# Patient Record
Sex: Female | Born: 1975 | Race: Black or African American | Hispanic: No | Marital: Single | Smoking: Current every day smoker
Health system: Southern US, Community
[De-identification: ages and names within clinical notes are randomized; demographics above are authoritative.]

## PROBLEM LIST (undated history)

## (undated) DIAGNOSIS — D219 Benign neoplasm of connective and other soft tissue, unspecified: Secondary | ICD-10-CM

## (undated) HISTORY — PX: APPENDECTOMY: SHX54

## (undated) HISTORY — PX: ABDOMINAL HYSTERECTOMY: SHX81

---

## 2001-12-13 ENCOUNTER — Emergency Department (HOSPITAL_COMMUNITY): Admission: EM | Admit: 2001-12-13 | Discharge: 2001-12-13 | Payer: Self-pay | Admitting: *Deleted

## 2004-11-10 ENCOUNTER — Inpatient Hospital Stay (HOSPITAL_COMMUNITY): Admission: AD | Admit: 2004-11-10 | Discharge: 2004-11-10 | Payer: Self-pay | Admitting: Obstetrics and Gynecology

## 2004-11-17 ENCOUNTER — Encounter: Admission: RE | Admit: 2004-11-17 | Discharge: 2004-11-17 | Payer: Self-pay | Admitting: Obstetrics and Gynecology

## 2004-11-30 ENCOUNTER — Ambulatory Visit: Payer: Self-pay | Admitting: Family Medicine

## 2004-12-09 ENCOUNTER — Ambulatory Visit: Payer: Self-pay | Admitting: Family Medicine

## 2004-12-23 ENCOUNTER — Ambulatory Visit: Payer: Self-pay | Admitting: Family Medicine

## 2005-01-01 ENCOUNTER — Ambulatory Visit: Payer: Self-pay | Admitting: Family Medicine

## 2005-01-01 ENCOUNTER — Inpatient Hospital Stay (HOSPITAL_COMMUNITY): Admission: AD | Admit: 2005-01-01 | Discharge: 2005-01-02 | Payer: Self-pay | Admitting: Family Medicine

## 2005-01-04 ENCOUNTER — Ambulatory Visit: Payer: Self-pay | Admitting: Obstetrics & Gynecology

## 2005-01-04 ENCOUNTER — Inpatient Hospital Stay (HOSPITAL_COMMUNITY): Admission: AD | Admit: 2005-01-04 | Discharge: 2005-01-16 | Payer: Self-pay | Admitting: *Deleted

## 2005-01-06 ENCOUNTER — Encounter (INDEPENDENT_AMBULATORY_CARE_PROVIDER_SITE_OTHER): Payer: Self-pay | Admitting: Specialist

## 2005-01-11 ENCOUNTER — Ambulatory Visit: Payer: Self-pay | Admitting: Pulmonary Disease

## 2005-01-24 ENCOUNTER — Ambulatory Visit: Payer: Self-pay | Admitting: Family Medicine

## 2005-03-09 ENCOUNTER — Encounter: Admission: RE | Admit: 2005-03-09 | Discharge: 2005-03-09 | Payer: Self-pay | Admitting: *Deleted

## 2005-04-12 IMAGING — CT CT HEAD W/O CM
1 series · 16 of 25 positions shown, 20 images · IV contrast (agent unspecified)
Comparison: none

CLINICAL DATA: Headaches.  The patient is recently post-partum with HELLP syndrome.  
 HEAD CT WITHOUT CONTRAST: 
 Routine unenhanced study was performed.  Comparison 01/05/2005. 
 There is new subarachnoid hemorrhage over both frontal convexities, asymmetric to the right.  No parenchymal hemorrhage, brain edema or focal extraaxial fluid collection is demonstrated.  The ventricles are stable in size without evidence of hydrocephalus.  Air fluid level is again noted within the left maxillary sinus.  There is also stable near complete opacification of the mastoid air cells and middle ear on the left without associated coalescence.  The right mastoid air cells and right middle ear appear well aerated.

[Series 2: — · axial · 0.47mm/px · z∈[+6,+142]mm · 16 of 25 slices shown, 20 images]
[im 2/25  brain]
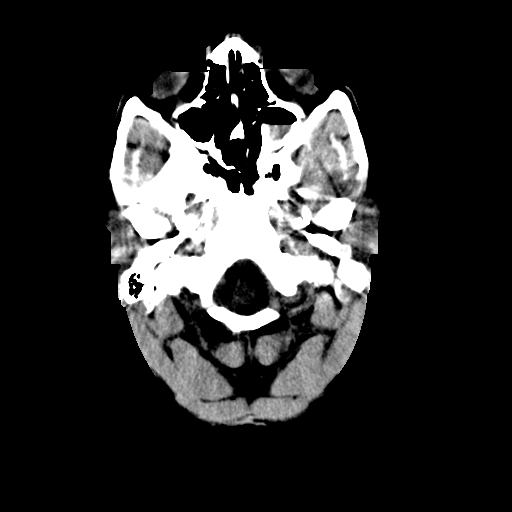
[im 2/25  bone]
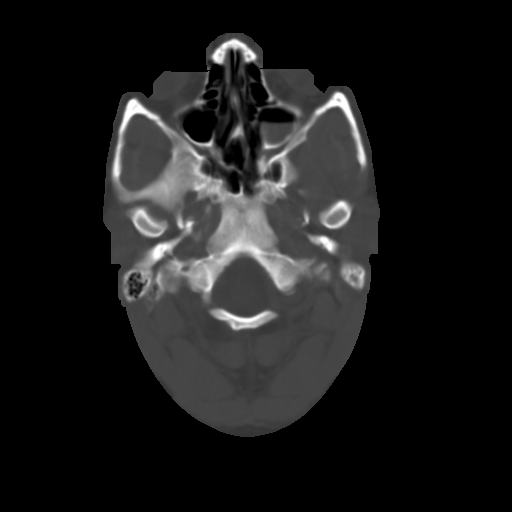
[im 4/25  brain]
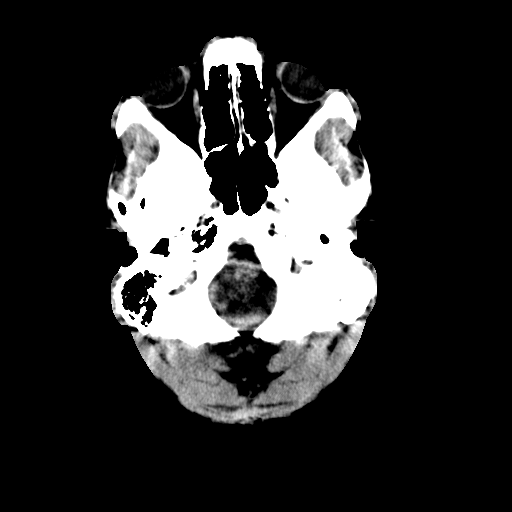
[im 5/25  brain]
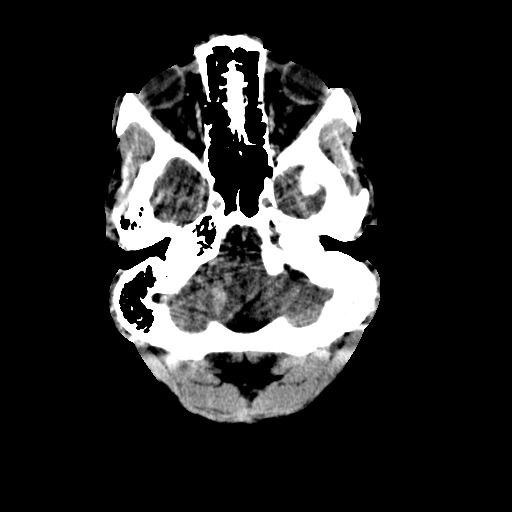
[im 7/25  brain]
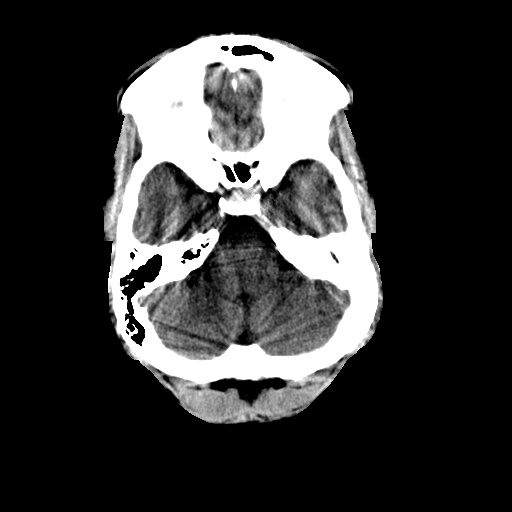
[im 8/25  brain]
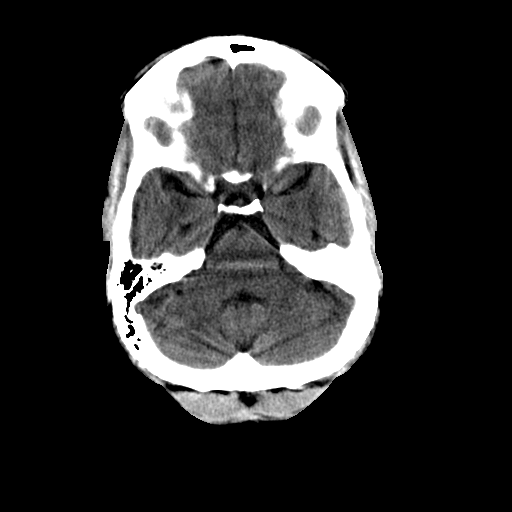
[im 8/25  bone]
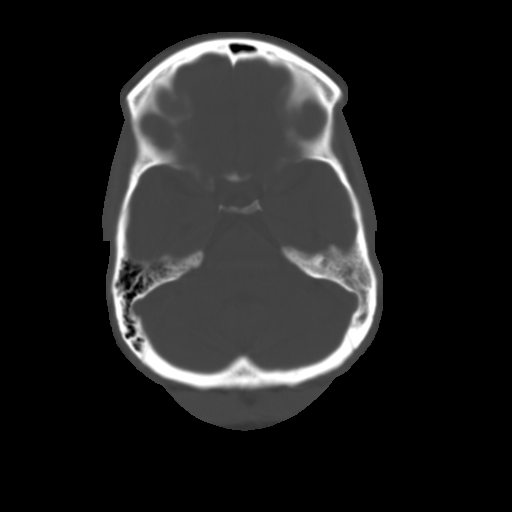
[im 9/25  brain]
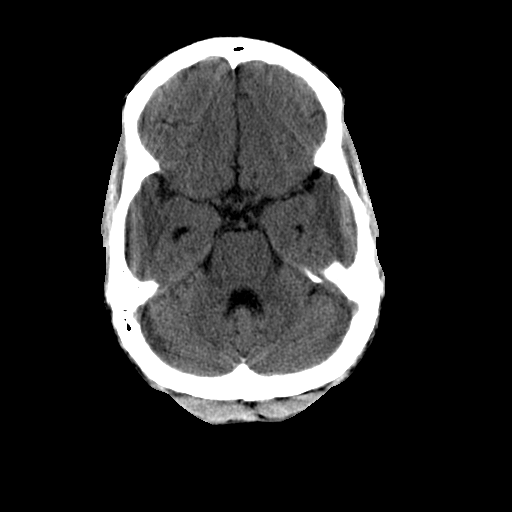
[im 11/25  brain]
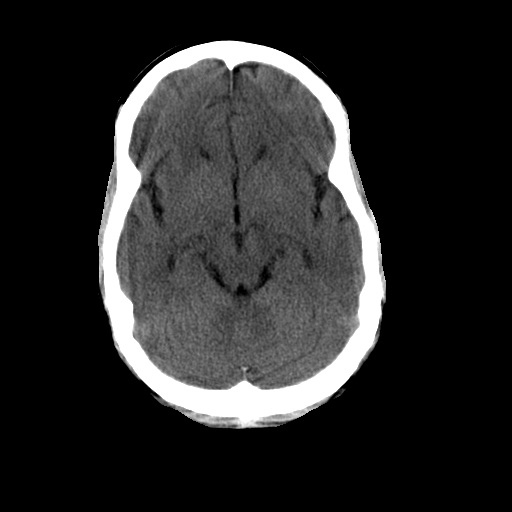
[im 12/25  brain]
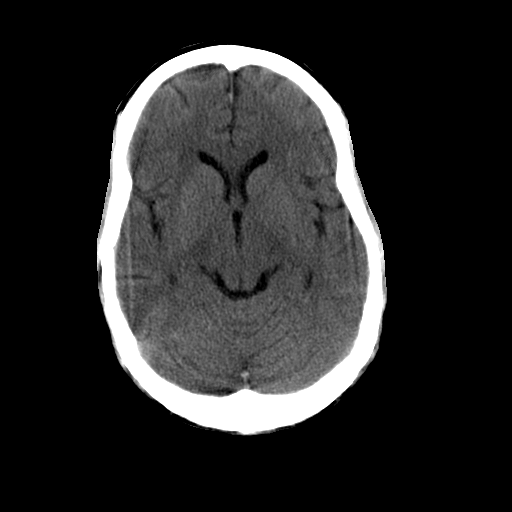
[im 14/25  brain]
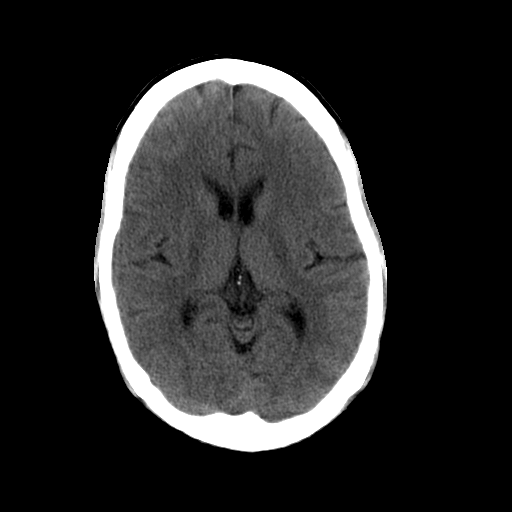
[im 14/25  bone]
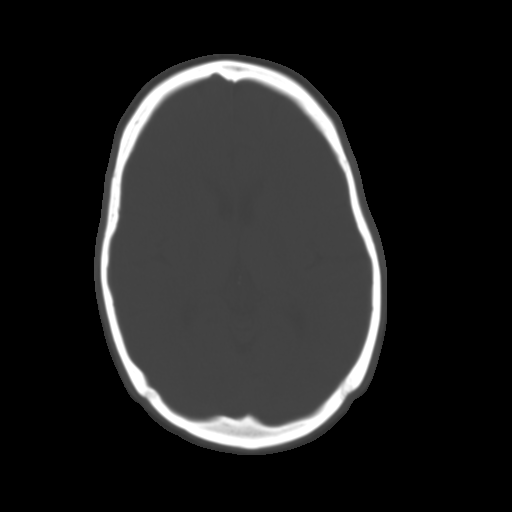
[im 15/25  brain]
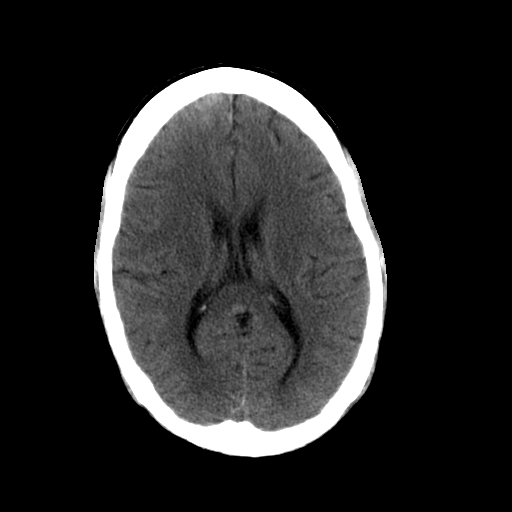
[im 17/25  brain]
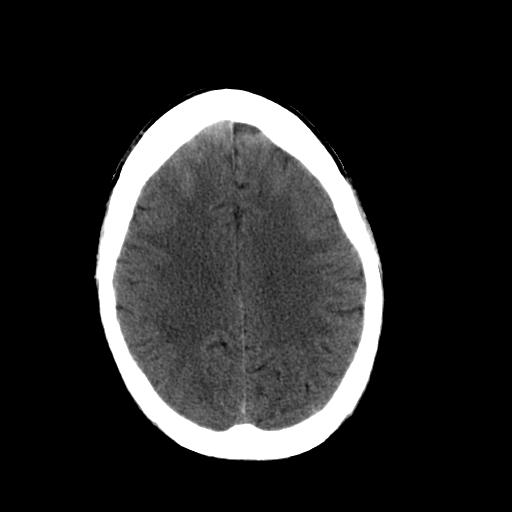
[im 18/25  brain]
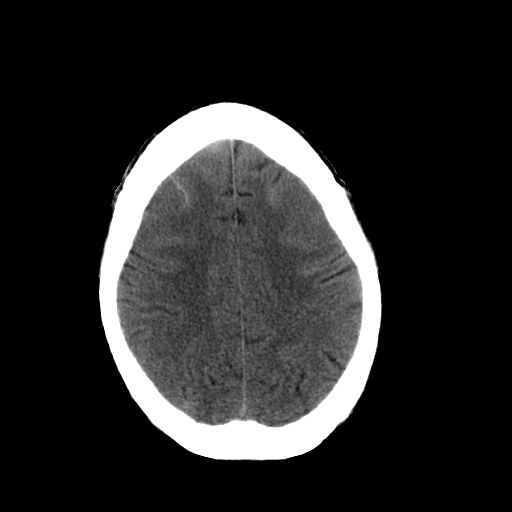
[im 19/25  brain]
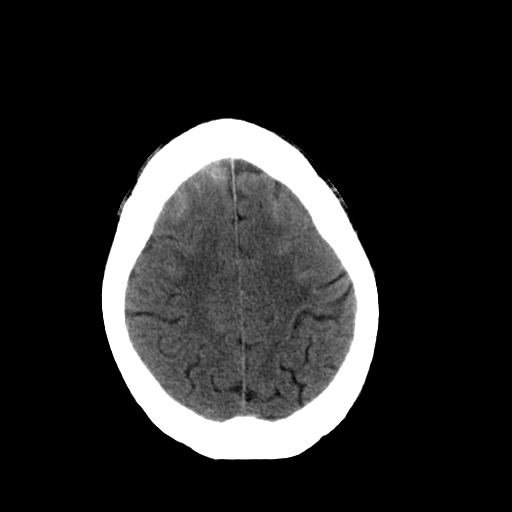
[im 19/25  bone]
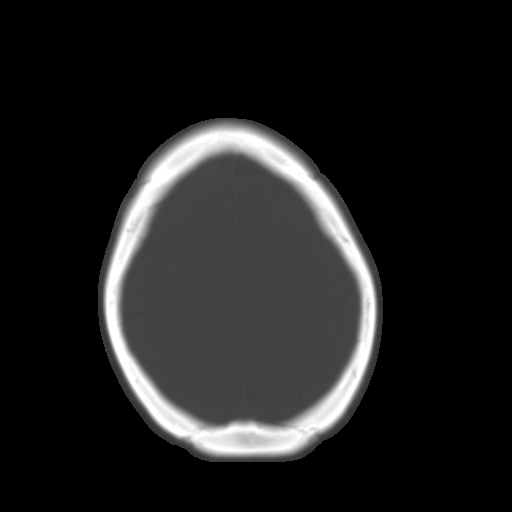
[im 21/25  brain]
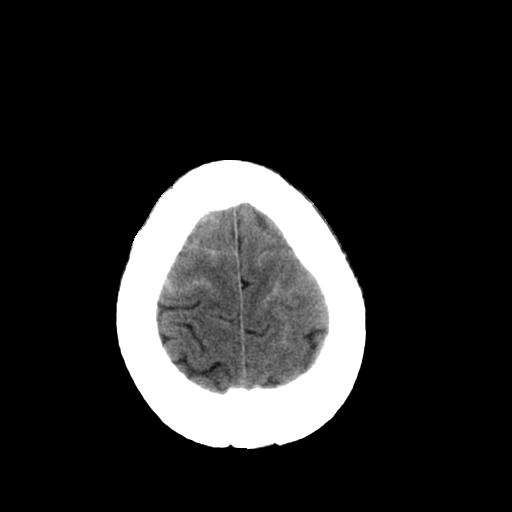
[im 22/25  brain]
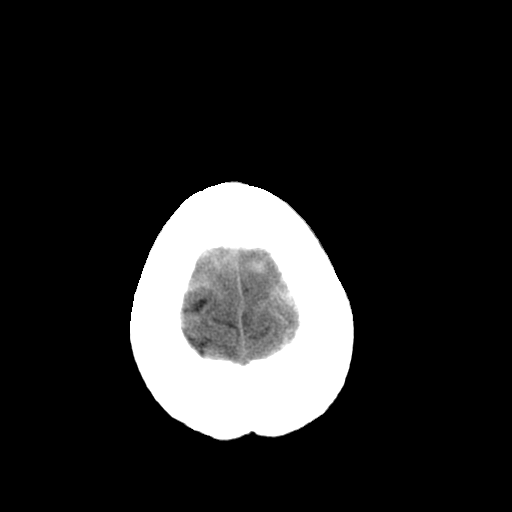
[im 24/25  brain]
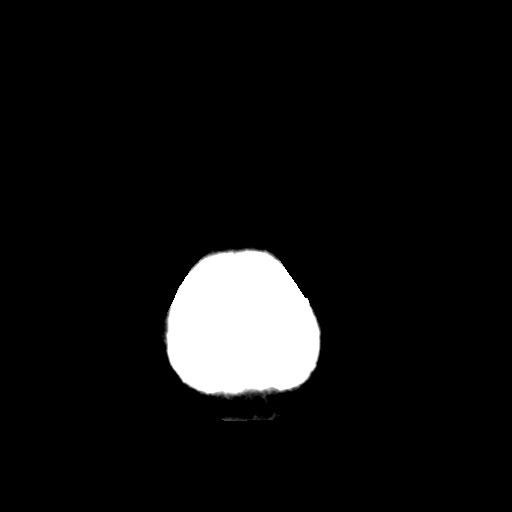

[16 of 25 positions shown; findings below may reference images not displayed]

IMPRESSION: 1. There is new subarachnoid hemorrhage over both frontal convexities, likely related to thrombocytopenia and HELLP syndrome.  No intraparenchymal hemorrhage or brain edema is demonstrated.      
 2.  Stable left maxillary sinus, left mastoid sinus and middle ear inflammatory changes.

## 2007-02-01 DIAGNOSIS — R51 Headache: Secondary | ICD-10-CM

## 2007-02-01 DIAGNOSIS — Z87891 Personal history of nicotine dependence: Secondary | ICD-10-CM | POA: Insufficient documentation

## 2007-02-01 DIAGNOSIS — I10 Essential (primary) hypertension: Secondary | ICD-10-CM | POA: Insufficient documentation

## 2007-02-01 DIAGNOSIS — R519 Headache, unspecified: Secondary | ICD-10-CM | POA: Insufficient documentation

## 2008-05-18 ENCOUNTER — Emergency Department (HOSPITAL_COMMUNITY): Admission: EM | Admit: 2008-05-18 | Discharge: 2008-05-18 | Payer: Self-pay | Admitting: Emergency Medicine

## 2008-05-21 ENCOUNTER — Inpatient Hospital Stay (HOSPITAL_COMMUNITY): Admission: AD | Admit: 2008-05-21 | Discharge: 2008-05-21 | Payer: Self-pay | Admitting: Obstetrics and Gynecology

## 2008-08-17 IMAGING — US US ART/VEN ABD/PELV/SCROTUM DOPPLER COMPLETE
1 series · 13 of 25 positions shown · non-contrast
Comparison: 01/06/2005

CLINICAL DATA: Right-sided pelvic pain.

TRANSABDOMINAL AND TRANSVAGINAL ULTRASOUND OF PELVIS
DOPPLER ULTRASOUND OF OVARIES
TECHNIQUE: Both transabdominal and transvaginal ultrasound
examinations of the pelvis were performed including evaluation of
the uterus, ovaries, adnexal regions, and pelvic cul-de-sac. Color
and duplex Doppler ultrasound was utilized to evaluate blood flow
to the ovaries.

[Series 1: unknown · 0.33mm/px · 13 of 57 slices shown]
[im 1/57]
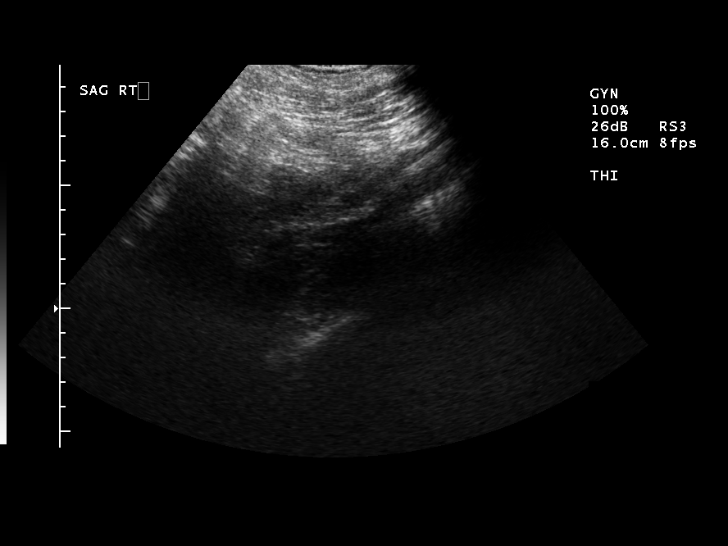
[im 5/57]
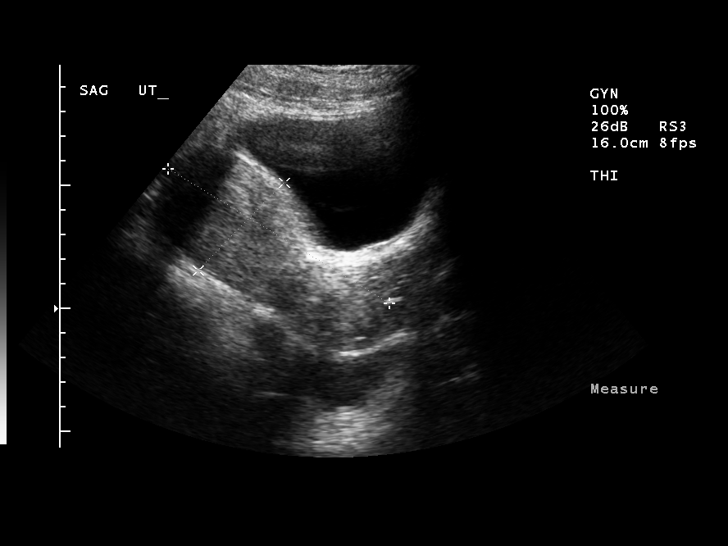
[im 10/57]
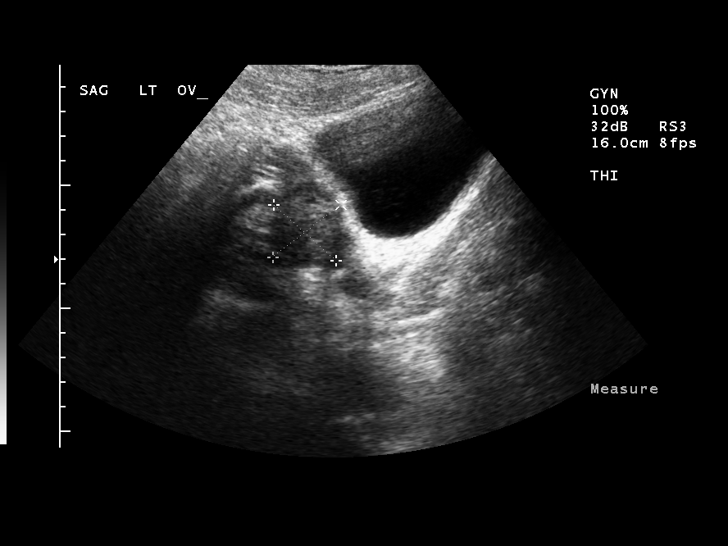
[im 15/57]
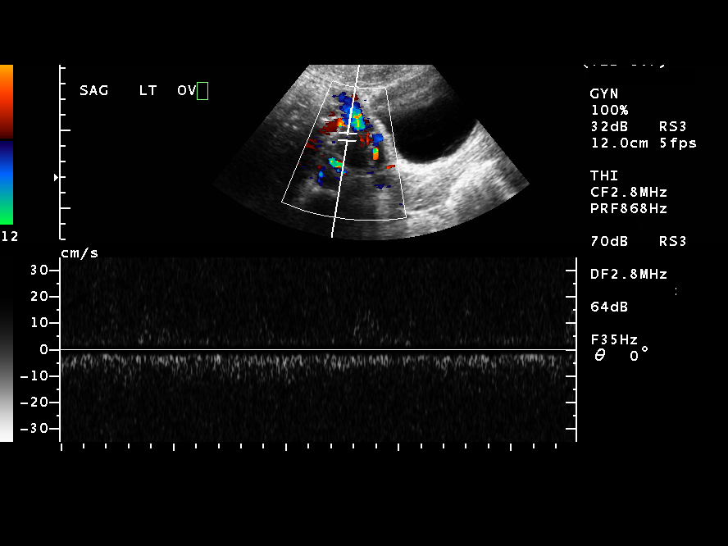
[im 19/57]
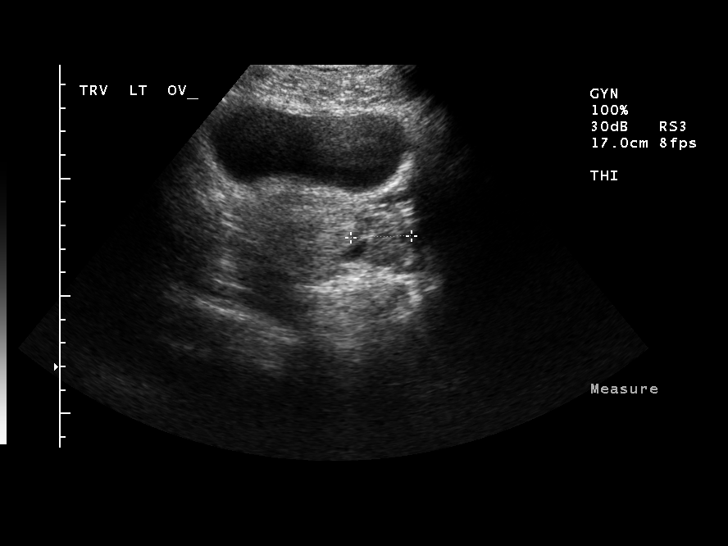
[im 24/57]
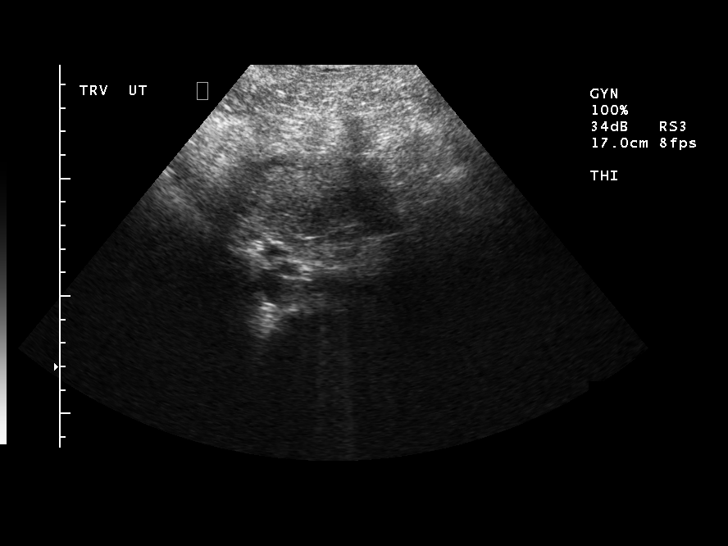
[im 29/57]
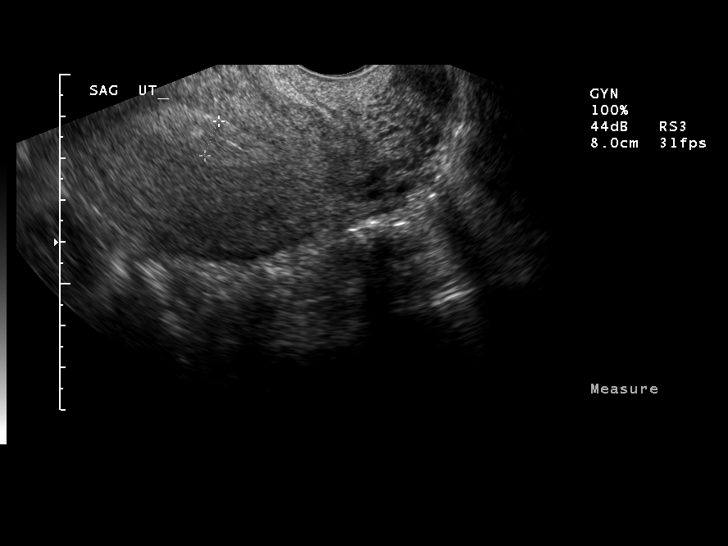
[im 33/57]
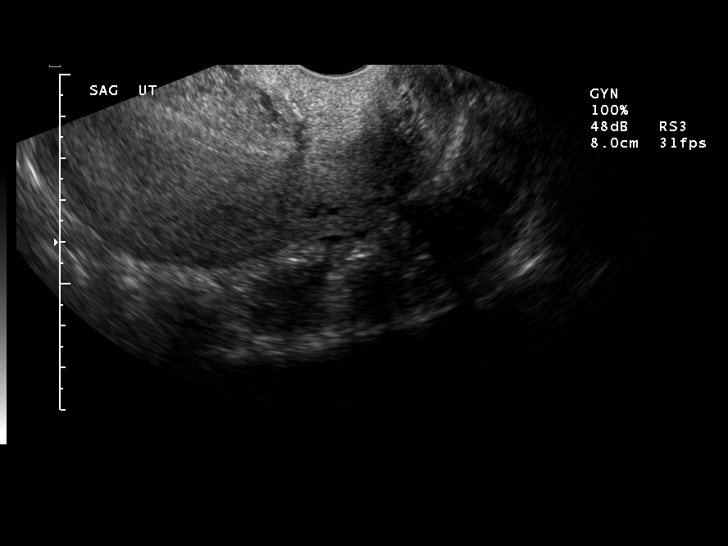
[im 38/57]
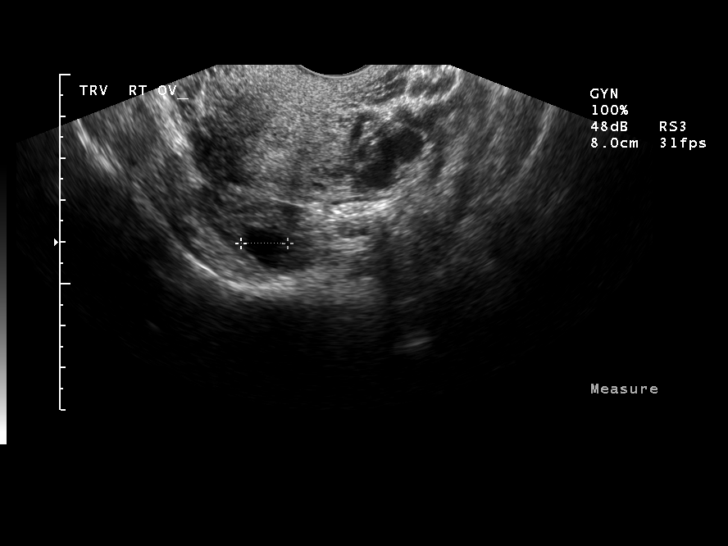
[im 43/57]
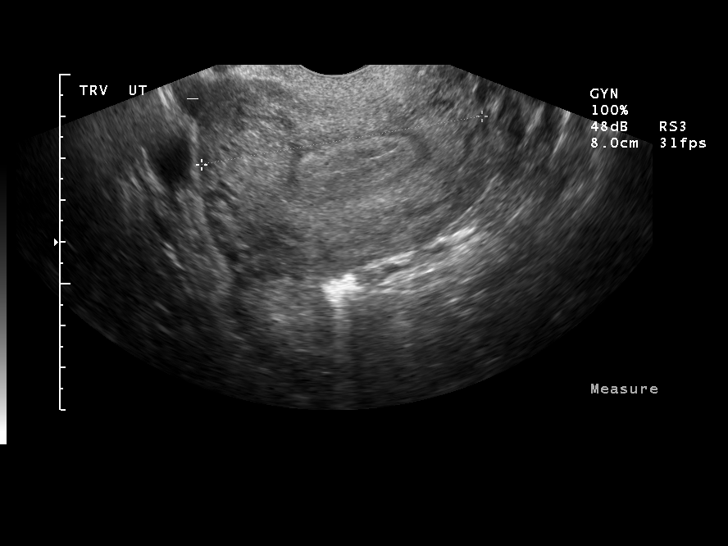
[im 47/57]
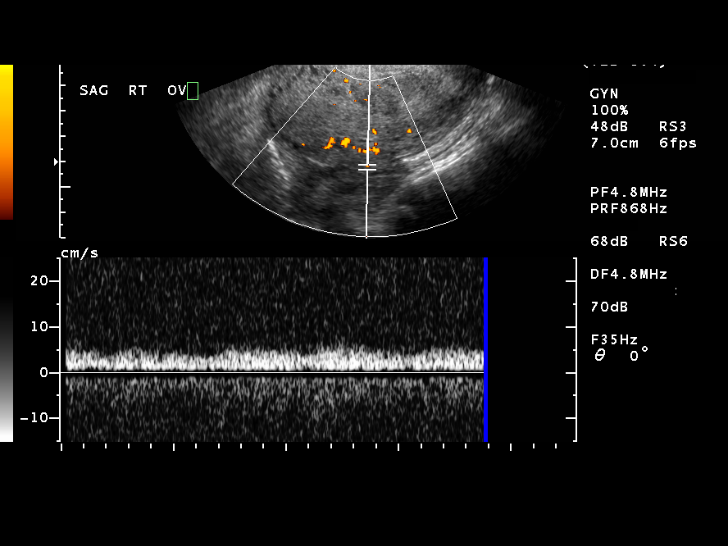
[im 52/57]
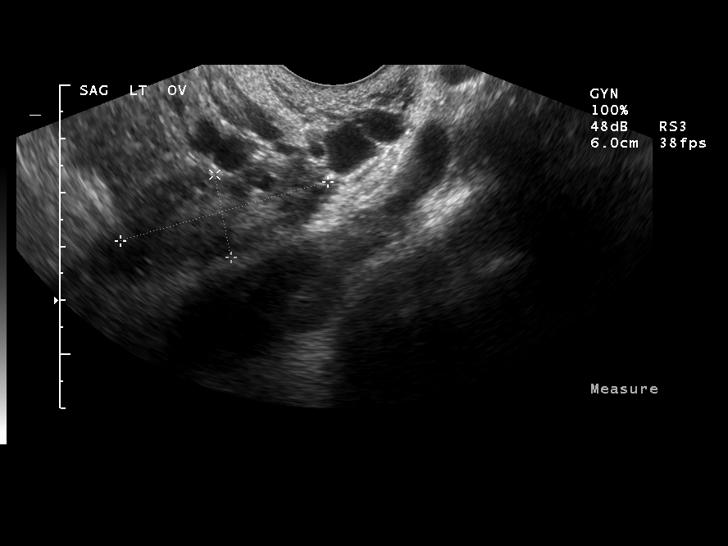
[im 57/57]
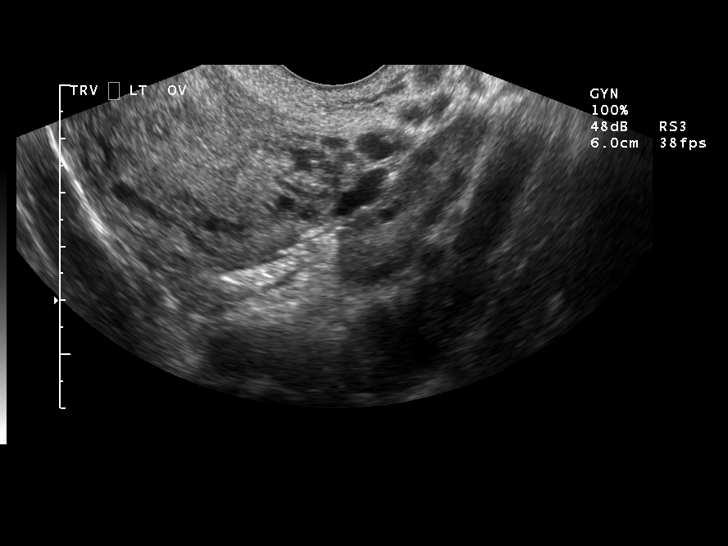

[13 of 25 positions shown; findings below may reference images not displayed]

FINDINGS: Uterine length is 10.5 cm.  There is a small amount of
free pelvic fluid noted.  The endometrial stripe measures 9 mm in
thickness, without significant irregularity. The the patient had
significant tenderness during sonographic evaluation.

The right ovary measures 4.2 by 2.0 x 2.5 cm and contains a 1.1 cm
cyst.  The left ovary measures 3.4 x 3.5 x 2.6 cm and appears
unremarkable.  We demonstrate normal arterial and venous waveforms
in both ovaries.
IMPRESSION: 1.  There is a 11 mm cyst in the right ovary, but otherwise the
right ovary appears sonographically unremarkable. The patient did
have significant tenderness during exam.
2.  Small amount of free pelvic fluid.
3.  There is no evidence of torsion based on Doppler evaluation.
4.  If there is any chance of pregnancy then a pregnancy test would
be recommended to exclude the unlikely possibility of occult
ectopic pregnancy.

## 2011-04-22 NOTE — Consult Note (Signed)
NAME:  [name], [name] NO.:  [id number]   MEDICAL RECORD NO.:  [id number]          PATIENT TYPE:  INP   LOCATION:  9199                          FACILITY:  [facility]   PHYSICIAN:  Garnetta Buddy, M.D.   DATE OF BIRTH:  07/29/1976   DATE OF CONSULTATION:  DATE OF DISCHARGE:                                   CONSULTATION   This 35 year old African-American female, approximately [redacted] weeks pregnant,  gravida 1, para 1 with a history of herpes simplex virus was admitted to the  hospital with a five-day history of nausea, vomiting, and headache with  photophobia.  She had frequent loose bowel movements and was admitted on  January 04, 2005.  Her 24-hour urine collection revealed 1.69 gm of  proteinuria.  Serum creatinine was 0.8.  She had an increased uric acid,  decreased platelets, and increasing blood pressure.  Clinically, she was  found to have peripheral edema.  She was taken to the OR and a C-section  with delivery of a 45-week-old healthy baby boy.  She has had one liter  bolus in the operating room with an estimated blood loss of 800 cc and 400  cc intraoperatively.  No urine output has been recorded in the  intraoperative period.   PAST MEDICAL HISTORY:  History of sinusitis.   CURRENT MEDICATIONS:  Zithromax.   SOCIAL HISTORY:  Unmarried.  Cocaine use.  Marijuana use.  Lives with  father.   FAMILY HISTORY:  Unknown.   REVIEW OF SYSTEMS:  Status post C-section.  Lethargy.  Nonobtainable but  otherwise negative.   PHYSICAL EXAMINATION:  VITAL SIGNS:  Blood pressure 156/113, pulse 105,  temperature afebrile.  In's and out's being recorded with 20 cc in the Foley  after two hours.  GENERAL:  Lethargic with mild periorbital edema.  HEENT:  Funduscopic evaluation revealed no evidence of hemorrhage, exudates,  papilledema.  Ears, nose, mouth, throat with moist mucous membranes.  NECK:  Supple with no evidence of JVD.  She had a hematoma located on the  right  anterior triangle of the neck.  RESPIRATORY:  Clear to auscultation.  No wheezes or rales.  CARDIOVASCULAR:  Flat JVD.  CVP 4.  Regular rate and rhythm.  Tachycardic.  ABDOMEN:  C-section.  Appropriately tender, nondistended.  EXTREMITIES:  Edema 1+.  Pulse of 1+, right and left, full volume.   LABS:  WBC 12.8, hemoglobin 12.6, platelets 94.  Sodium 139, potassium 4.2,  chloride 103, CO2 22, BUN 11, creatinine 1.5, glucose 86, albumin 2.5, LDH  592, uric acid 6.5.   CHEST X-RAY:  No edema.   ASSESSMENT/PLAN:  1.  Acute renal failure, status post cesarean section, probably secondary to      volume depletion with acute tubular necrosis.  Continue to avoid      nephrotoxins.  Replete intravenous fluids with one liter and maintenance      of D5      normal saline at 125 cc/hr.  2.  Hypertension:  Will continue to follow.  Will treat if diastolic blood      pressure greater  than 115 with labetalol drip.      MWW/MEDQ  D:  01/06/2005  T:  01/06/2005  Job:  [id number]

## 2011-04-22 NOTE — Consult Note (Signed)
NAMEKAZIAH, KRIZEK NO.:  [id number]   MEDICAL RECORD NO.:  [id number]          PATIENT TYPE:  INP   LOCATION:  2105                         FACILITY:  [facility]   PHYSICIAN:  Danae Orleans. Venetia Maxon, M.D.  DATE OF BIRTH:  1976-01-24   DATE OF CONSULTATION:  01/11/2005  DATE OF DISCHARGE:                                   CONSULTATION   REASON FOR CONSULTATION:  HELLP syndrome with subarachnoid hemorrhage.   HISTORY OF PRESENT ILLNESS:  [name] is a 35 year old woman admitted  to [facility] on January 04, 2005, with a viral gastroenteritis and  mild preeclampsia with dehydration and sinusitis who developed proteinuria  and worsening elevation of blood pressure.  She had a C-section delivery of  a healthy girl on January 06, 2005, and then developed HELLP syndrome and  DIC.  She received 2 units of packed red cells, 1 unit of fresh frozen  plasma and 1 unit of cryoprecipitate.  She was started on labetalol drip for  elevated blood pressure and had a creatinine up to 2.9 and seen by the renal  service.  She improved on February 4, and then on February 6, developed  worsening headache with elevated blood pressure to 196/118 and diplopia.  A  head CT shows bifrontal subarachnoid hemorrhage.  She was started on  labetalol and magnesium sulfate drip and was transferred to [facility].  Currently, the patient was is on labetalol drip and magnesium  sulfate.  She is lethargic, but responsive.   PHYSICAL EXAMINATION:  NEUROLOGIC:  The patient awakens, is somnolent, but  arousable.  She will state her name.  She knows her age.  She knows where  she is.  She opens her eyes.  She has mild photophobia.  Her pupils equal  round and reactive to light, extraocular movements intact.  She has no  complaints of diplopia.  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  She  complains of a bifrontal headache.  Facial motor and sensation are intact  and symmetric.  Hearing is  intact to finger rub.  Palate is upgoing.  Shoulder shrug is symmetric.  Tongue protrudes in the midline.  She moves  all extremities well with full power without pronator drift.  She denies any  numbness in her upper or lower extremities.  Reflexes are 2 in the biceps,  triceps and brachioradialis, 3 at the knees, 2 at the ankles.   IMPRESSION:  [name] is a 35 year old, young woman who has just  delivered by cesarean section a young girl on February 2.  She has had  preeclampsia, proteinuria, acute renal failure and HELLP syndrome.  She now  has frontal convexity subarachnoid hemorrhage.   RECOMMENDATIONS:  1.  I have no suspicion of an aneurysmal cause, but think this is entirely      secondary to hemolysis, elevated liver enzymes and low platelet count      (HELLP) syndrome, hypertension and low platelet count.  The patient      needs to have control of her blood pressure and the labetalol is fine.  2.  She needs to have serial neurologic exams.  Repeat head CT if she has      declining level of consciousness.  3.  Need to keep her platelet counts greater than or equal to 100,000 and      keep her sodium normal.  There is no need for adding an anticonvulsant      unless she has a seizure.  The likelihood of vasospasm is low given the      peripheral and small amount of subarachnoid blood.  Therefore, I do      think she needs to be treated with nimodipine, but if certainly      nimodipine could be used to control her blood pressure and it may have      some beneficial effects in terms of preventing vasospasm, but this is      not a requirement given the likelihood of      vasospasm is extremely low.  4.  No need for formal angiography at this point.  5.  Please call with questions or concerns.      JDS/MEDQ  D:  01/11/2005  T:  01/11/2005  Job:  [id number]

## 2011-04-22 NOTE — Op Note (Signed)
NAME:  [name], [name] NO.:  [id number]   MEDICAL RECORD NO.:  [id number]          PATIENT TYPE:  WOC   LOCATION:  WOC                          FACILITY:  [facility]   PHYSICIAN:  Lesly Dukes, M.D. DATE OF BIRTH:  05-Oct-1976   DATE OF PROCEDURE:  01/06/2005  DATE OF DISCHARGE:                                 OPERATIVE REPORT   PREOPERATIVE DIAGNOSES:  67.  A 35 year old, para 1 at 19 weeks estimated gestational age with severe      preeclampsia and nonreassuring fetal heart tracing for repeat cesarean      section.  2.  Multiparity desiring sterility.   POSTOPERATIVE DIAGNOSES:  57.  A 35 year old, para 1 at 29 weeks estimated gestational age with severe      preeclampsia and nonreassuring fetal heart tracing for repeat cesarean      section.  2.  Multiparity desiring sterility.   PROCEDURE:  Repeat low flap transverse cesarean section plus BTL.   SURGEON:  Lesly Dukes, M.D.   ASSISTANT:  Jon Gills, M.D.   ESTIMATED BLOOD LOSS:  800   COMPLICATIONS:  None.   PATHOLOGY:  Placenta.   FINDINGS:  Viable female infant; Apgar's 3 at 1 minute and 6 at 5 minutes;  vertex; clear fluid; __________ placenta with three-vessel cord; NICU team  at delivery; grossly normal ovaries, uterus and fallopian tubes. Arterial  blood gas equals 7.97/pCO2 equals 81/PO2 equals12/HCO3 equals 17.8/base  excess equals -15.8. Venous cord gas equals 6.99/pCO2 equals 76/PO2 equal  19/base excess equals -16.4.   DESCRIPTION OF PROCEDURE:  After informed consent was obtained, the patient  was taken to the operating room where spinal anesthesia was quickly induced.  The patient was placed in dorsal supine position with a leftward tilt and  the patient was prepared and draped in the normal sterile fashion.  A Foley  was placed in the bladder.  A Pfannenstiel skin incision was made with a  scalpel and carried down to the underlying layer of fascia, the fascia was  incised in  the midline, this was extended bilaterally with the Mayo  scissors. The superior and inferior aspect of the fascial incision were  grasped with Kocher clamps, tinted up, dissected down sharply and bluntly in  the underlying layers of rectus muscles.  The rectus muscles were separated  in the midline.  The peritoneum was entered bluntly and the incision was  extended both superiorly and inferiorly with good visualization of the  bladder.  The bladder blade was inserted.  The vesicouterine peritoneum was  identified, tinted up and entered sharply with the Metzenbaum scissors. This  incision was extended bilaterally, the bladder flap was created digitally.  The bladder blade was reinserted.  The lower uterine incision was made with  the scalpel in a transverse fashion. This incision was extended bilaterally  with the bandage scissors.  The baby's head delivered easily and the nose  and mouth were suctioned. The rest of the body of the baby delivered easily.  The cord was clamped and cut and the baby was handed off to the waiting  neonatologist. Cord blood was never typed and screen and arterial blood gas  was sent with a venous cord gas stent approximately 15 minutes later. The  placenta delivered spontaneously and had a three vessel cord. The uterus was  exteriorized and cleared of all clots and debris. The uterine incision was  closed with #0 Vicryl in a running locked fashion. Good hemostasis was  noted. The uterus was returned to the abdomen and noted to be hemostatic off  tension. The abdomen was irrigated and hemostasis was assured one last time.  The peritoneum and rectus muscles were noted to be hemostatic.  The fascia  was then closed with #0 Vicryl in a running fashion. The subcutaneous tissue  was copiously irrigated and found to be hemostatic.  The skin was closed  with staples.  The patient tolerated the procedure well. Sponge, lap,  instrument and needle counts were correct x2 and  the patient was taken to  the recovery room in stable condition.      KHL/MEDQ  D:  01/06/2005  T:  01/06/2005  Job:  [id number]

## 2011-04-22 NOTE — Discharge Summary (Signed)
[name], [name]              ACCOUNT NO.:  [id number]   MEDICAL RECORD NO.:  [id number]          PATIENT TYPE:  INP   LOCATION:  9374                          FACILITY:  [facility]   PHYSICIAN:  Conni Elliot, M.D.DATE OF BIRTH:  [birth year]   DATE OF ADMISSION:  01/04/2005  DATE OF DISCHARGE:  01/11/2005                                 DISCHARGE SUMMARY   ADMISSION DIAGNOSES:  5.  35 year old gravida 3, para 1-0-1-1, at 33 weeks and 6 days with      dehydration.  2.  Fetal heart rate reassuring.   DISCHARGE DIAGNOSES:  48.  35 year old gravida 3, para 1-1-1-2, postoperative day #10, low      transverse cesarean section/bilateral tubal ligation.  2.  Status post hemolysis, elevated liver enzymes, low platelets,      disseminated intravascular coagulation, acute renal failure,      subarachnoid hemorrhage.  3.  Viable female with Apgars 3 at one minute and 6 at five minutes.  Baby      in neonatal intensive care unit.   DISCHARGE MEDICATIONS:  1.  Labetalol 600 mg p.o. b.i.d.  2.  HCTZ 25 mg p.o. daily.  3.  Tylenol 500 mg one to two tablets p.o. q.4h p.r.n. headache.   HISTORY OF PRESENT ILLNESS:  A 35 year old gravida 3, para 1-0-1-1, at 33  weeks and 6 days presented at MAU at [facility] with five-day history  of diarrhea and vomiting.  Also history of ear pain and frontal headache  with right face ache.  The patient also complained of seeing blinking stars.  The patient presented with no uterine contractions.  SVE:  Closed, high,  thick.  FHT reassuring.  The patient was admitted for dehydration.   HOSPITAL COURSE:  Problem 1.  A 35 year old gravida 3, para 1-0-1-1, at 33  weeks and 6 days admitted for dehydration secondary to viral gastroenteritis  with vomiting and diarrhea.  On January 05, 2005, the patient presented with  increased blood pressure with a range for a systolic blood pressure from 160  to 170 and diastolic blood pressure from 90 to 110.  PIH panel  was abnormal.  Severe preeclampsia was diagnosed.  The patient was placed on magnesium and  labetalol drip.  Despite treatment, the patient presented with worsening of  her proteinuria, increasing blood pressures, IUGR, and nonreassuring fetal  status.  Therefore, the patient underwent RLTCS and BTL.  The patient  initially tolerated well the procedure, but later the patient developed  HELLP, DIC, and required two units of packed red blood cells, one unit of  FFP, and one unit of CRYO with appropriate correction.  The patient was  closely followed with magnesium levels, PIH panels, and blood pressure  checks.  By January 10, 2005, blood pressure was still high.  The patient  was on labetalol 400 mg b.i.d.  Given this patient's uncontrolled blood  pressure, labetalol drip and magnesium were restarted.  Given the worsening  of the headache, a brain CT scan was requested and a new subarachnoid  hemorrhage over both frontal convexities was found.  The  patient was  transferred to [facility] from January 11, 2005, to January 13, 2005.  By January 14, 2005, the patient was POD #8, status post emergent  LTCS for NRFHT and severe preeclampsia.  The patient was placed at [facility] on AICU.  The patient was clinically stable.  The patient denied  headache, vision changes, chest pain, shortness of breath, or swelling.  Systolic blood pressures ranged from 130 to 170 and diastolic blood  pressures ranged from 90 to 100.  PIH panel showed platelets 244, AST 18,  ALT 37, LVH 31.  Creatinine was 0.8.  The patient has no abdominal pain.  Cesarean section area shows a well-approximated incision edges.  No sign of  infection.  Mild serosanguineous discharge.  Staples were removed on  January 14, 2005.  The patient has no vaginal bleeding.  By the day of  discharge, the patient's blood pressure was 145/90.  BMP was normal with  creatinine of 0.9.  PIH panel within normal limits.  The  patient will call  [facility] for a follow-up appointment at six weeks after cesarean  section.  Wound care per booklet instructions.   Problem 2.  Neuro.  Subarachnoid hemorrhage was diagnosed with a CT scan  done on January 10, 2005.  The patient presented with a headache of  increased intensity (10 out of 10).  The headache did not respond to the  treatment with blood pressure medication, Percocet x2, and morphine.  Head  CT showed a new subarachnoid hemorrhage over both frontal convexities,  likely related to thrombocytopenia, HELLP syndrome.  The patient was  transferred to [facility].  The patient was treated with labetalol  drip and magnesium to manage blood pressures and steroids to reduce cerebral  edema.  The neurology [facility] was most likely secondary to increased blood  pressures.  After the blood pressure was under control and the patient was  clinically stable, the patient was transferred to [facility] on  January 13, 2005.  The patient presented no neuro focal signs.  Since  January 13, 2005, the patient has not had headache, vision changes, chest  pain, shortness of breath, or swelling.  A follow-up appointment will be  arranged for this patient with neurology.  The patient will be contacted  with appointment information.   Problem 3.  Renal.  ARF secondary to preeclampsia.  Renal was consulted.  Per renal, ARF was most likely secondary to the volume decrease with  possible acute tubular necrosis.  Creatinine went up to 2.9.  The patient  was treated with increased IV fluids and Lasix.  By January 10, 2005, this  patient's creatinine was 0.9.  Renal signed off.  ARF was resolved.  By day  of discharge, creatinine was 0.9.  The patient presented good urine output.   Problem 4.  HEME.  The patient presented with anemia secondary to DIC and  HELLP.  The patient received two packs of red blood cells.  The patient was stable.  By the day of discharge, the  patient's hemoglobin was 9.6,  hematocrit 28.6.  Platelets 244.  No need for iodine.  To be followed by  primary physician.   Problem 5.  Calf pain.  The patient during the days of hospitalization  complained of calf pain.  Extremity Doppler was performed on January 11, 2005.  Lower extremity Doppler ruled out DVT, Baker's cyst, and superficial  thrombophlebitis.  By the day of discharge, the  patient has no calf pain.   DISCHARGE LABORATORY DATA:  WBC 15.5, hemoglobin 9.6, hematocrit 28.6,  platelets 244.  MCV 91.9.  BMP; sodium 142, potassium 3.6, chloride 107,  TCO2 23, BUN 13, creatinine 0.9, glucose 88.  Calcium 8.9.  Uric acid 3.6,  AST 18, ALT 33, alkaline phosphatase 91, LDH 311, magnesium 2.0.  The  patient's blood type is O Rh positive.  Antibody screen negative.  Rubella  immune.  GBS positive on October 23, 2004.   CONDITION ON DISCHARGE:  Stable.   DISCHARGE INSTRUCTIONS:  Pelvic rest for six weeks.  The patient will  schedule a follow up appointment in six weeks at [facility].  The  patient understands that this six-week follow-up appointment since she has a  cesarean section.  The patient will be contacted with approximately from  primary physician and an appointment for neurology.      FIM/MEDQ  D:  01/16/2005  T:  01/16/2005  Job:  [id number]

## 2011-09-01 LAB — DIFFERENTIAL
Basophils Relative: 1
Eosinophils Absolute: 0
Eosinophils Relative: 1
Monocytes Relative: 13 — ABNORMAL HIGH
Neutro Abs: 2.4

## 2011-09-01 LAB — URINALYSIS, ROUTINE W REFLEX MICROSCOPIC
Bilirubin Urine: NEGATIVE
Hgb urine dipstick: NEGATIVE
Nitrite: NEGATIVE
Protein, ur: NEGATIVE
Urobilinogen, UA: 0.2

## 2011-09-01 LAB — COMPREHENSIVE METABOLIC PANEL
Albumin: 3.7
BUN: 8
Calcium: 9
Creatinine, Ser: 0.83
Total Bilirubin: 0.8
Total Protein: 6.9

## 2011-09-01 LAB — CBC
HCT: 40.5
MCHC: 34.1
MCV: 87.9
Platelets: 194
RDW: 12.9

## 2018-03-08 ENCOUNTER — Encounter (HOSPITAL_COMMUNITY): Payer: Self-pay

## 2018-03-08 ENCOUNTER — Emergency Department (HOSPITAL_COMMUNITY)
Admission: EM | Admit: 2018-03-08 | Discharge: 2018-03-09 | Disposition: A | Discharge status: Home / Self Care | Payer: Medicaid Other | Attending: Emergency Medicine | Admitting: Emergency Medicine

## 2018-03-08 ENCOUNTER — Other Ambulatory Visit: Payer: Self-pay

## 2018-03-08 DIAGNOSIS — Z5321 Procedure and treatment not carried out due to patient leaving prior to being seen by health care provider: Secondary | ICD-10-CM | POA: Diagnosis not present

## 2018-03-08 DIAGNOSIS — R1110 Vomiting, unspecified: Secondary | ICD-10-CM | POA: Diagnosis present

## 2018-03-08 HISTORY — DX: Benign neoplasm of connective and other soft tissue, unspecified: D21.9

## 2018-03-08 LAB — CBC WITH DIFFERENTIAL/PLATELET
BASOS ABS: 0 10*3/uL (ref 0.0–0.1)
Basophils Relative: 0 %
EOS ABS: 0 10*3/uL (ref 0.0–0.7)
EOS PCT: 0 %
HCT: 34.4 % — ABNORMAL LOW (ref 36.0–46.0)
HEMOGLOBIN: 11.4 g/dL — AB (ref 12.0–15.0)
LYMPHS PCT: 19 %
Lymphs Abs: 1.5 10*3/uL (ref 0.7–4.0)
MCH: 30.6 pg (ref 26.0–34.0)
MCHC: 33.1 g/dL (ref 30.0–36.0)
MCV: 92.2 fL (ref 78.0–100.0)
Monocytes Absolute: 0.5 10*3/uL (ref 0.1–1.0)
Monocytes Relative: 6 %
NEUTROS PCT: 75 %
Neutro Abs: 5.9 10*3/uL (ref 1.7–7.7)
PLATELETS: 222 10*3/uL (ref 150–400)
RBC: 3.73 MIL/uL — AB (ref 3.87–5.11)
RDW: 12.1 % (ref 11.5–15.5)
WBC: 7.9 10*3/uL (ref 4.0–10.5)

## 2018-03-08 LAB — URINALYSIS, ROUTINE W REFLEX MICROSCOPIC
Bacteria, UA: NONE SEEN
Bilirubin Urine: NEGATIVE
GLUCOSE, UA: NEGATIVE mg/dL
KETONES UR: 80 mg/dL — AB
Leukocytes, UA: NEGATIVE
Nitrite: NEGATIVE
PH: 6 (ref 5.0–8.0)
Protein, ur: NEGATIVE mg/dL
Specific Gravity, Urine: 1.024 (ref 1.005–1.030)

## 2018-03-08 LAB — COMPREHENSIVE METABOLIC PANEL
ALK PHOS: 46 U/L (ref 38–126)
ALT: 12 U/L — AB (ref 14–54)
AST: 20 U/L (ref 15–41)
Albumin: 3.5 g/dL (ref 3.5–5.0)
Anion gap: 7 (ref 5–15)
BUN: 7 mg/dL (ref 6–20)
CALCIUM: 8.7 mg/dL — AB (ref 8.9–10.3)
CO2: 23 mmol/L (ref 22–32)
CREATININE: 0.66 mg/dL (ref 0.44–1.00)
Chloride: 104 mmol/L (ref 101–111)
Glucose, Bld: 98 mg/dL (ref 65–99)
Potassium: 3.3 mmol/L — ABNORMAL LOW (ref 3.5–5.1)
Sodium: 134 mmol/L — ABNORMAL LOW (ref 135–145)
Total Bilirubin: 0.7 mg/dL (ref 0.3–1.2)
Total Protein: 6.6 g/dL (ref 6.5–8.1)

## 2018-03-08 LAB — LIPASE, BLOOD: Lipase: 27 U/L (ref 11–51)

## 2018-03-08 LAB — I-STAT TROPONIN, ED: Troponin i, poc: 0 ng/mL (ref 0.00–0.08)

## 2018-03-08 MED ORDER — ONDANSETRON 4 MG PO TBDP
4.0000 mg | ORAL_TABLET | Freq: Once | ORAL | Status: AC
  Administered 2018-03-08: 4 mg via ORAL
  Filled 2018-03-08: qty 1

## 2018-03-08 MED ORDER — OXYCODONE-ACETAMINOPHEN 5-325 MG PO TABS
1.0000 | ORAL_TABLET | Freq: Once | ORAL | Status: AC
  Administered 2018-03-08: 1 via ORAL
  Filled 2018-03-08: qty 1

## 2018-03-08 NOTE — ED Triage Notes (Signed)
Pt presents with continued vomiting since hysterectomy at [facility] on Tuesday.  Pt reports she was discharged today, was driving home with onset of shortness of breath and chest pain.  Pt reports no urine output since early this morning.

## 2018-03-08 NOTE — ED Provider Notes (Cosign Needed)
Patient placed in Quick Look pathway, seen and evaluated   Chief Complaint: Nausea and vomiting  HPI:   She had a hysterectomy on Tuesday at [place] and was discharged today, reports driving home with sudden onset shortness of breath and chest pain.  No trauma.  She has been throwing up multiple times.  Reports has not urinated yet today and has not had any pain medications since about 8am.    ROS: nausea, vomiting, no diarrhea  Physical Exam:   Gen: Appears ill, heaving  Neuro: Awake and Alert  Skin: Warm    Focused Exam: Lungs CTAB RRR.  Abdomen is diffusely TTP.    Initiation of care has begun. The patient has been counseled on the process, plan, and necessity for staying for the completion/evaluation, and the remainder of the medical screening examination   Lorin Glass, Hershal Coria 03/08/18 1505

## 2018-03-09 NOTE — ED Notes (Signed)
Pt called for recheck vitals, no answer 

## 2018-03-09 NOTE — ED Notes (Signed)
03/09/2018,  Attempted follow-up call, no answer.

## 2018-03-10 LAB — URINE CULTURE: CULTURE: NO GROWTH
